# Patient Record
Sex: Female | Born: 1965 | Race: White | Hispanic: No | Marital: Married | Smoking: Current every day smoker
Health system: Southern US, Community
[De-identification: ages and names within clinical notes are randomized; demographics above are authoritative.]

## PROBLEM LIST (undated history)

## (undated) DIAGNOSIS — L570 Actinic keratosis: Secondary | ICD-10-CM

## (undated) HISTORY — DX: Actinic keratosis: L57.0

---

## 2005-02-20 ENCOUNTER — Ambulatory Visit: Payer: Self-pay | Admitting: Urology

## 2007-04-17 ENCOUNTER — Ambulatory Visit: Payer: Self-pay | Admitting: Internal Medicine

## 2010-03-14 ENCOUNTER — Ambulatory Visit: Payer: Self-pay | Admitting: Internal Medicine

## 2012-05-14 ENCOUNTER — Ambulatory Visit: Payer: Self-pay | Admitting: Internal Medicine

## 2013-05-03 ENCOUNTER — Ambulatory Visit: Payer: Self-pay | Admitting: Family Medicine

## 2014-04-24 ENCOUNTER — Ambulatory Visit: Payer: Self-pay | Admitting: Internal Medicine

## 2015-02-26 ENCOUNTER — Ambulatory Visit (INDEPENDENT_AMBULATORY_CARE_PROVIDER_SITE_OTHER): Payer: No Typology Code available for payment source | Admitting: Physician Assistant

## 2018-12-13 ENCOUNTER — Other Ambulatory Visit: Payer: Self-pay

## 2018-12-13 ENCOUNTER — Ambulatory Visit: Payer: 59 | Admitting: Podiatry

## 2018-12-13 ENCOUNTER — Ambulatory Visit: Payer: 59

## 2018-12-13 ENCOUNTER — Encounter: Payer: Self-pay | Admitting: Podiatry

## 2018-12-13 VITALS — Temp 97.6°F

## 2018-12-13 DIAGNOSIS — B351 Tinea unguium: Secondary | ICD-10-CM

## 2018-12-13 DIAGNOSIS — L989 Disorder of the skin and subcutaneous tissue, unspecified: Secondary | ICD-10-CM

## 2018-12-13 MED ORDER — TERBINAFINE HCL 250 MG PO TABS: 250.0000 mg | ORAL_TABLET | Freq: Every day | ORAL | 0 refills | Status: AC

## 2018-12-13 NOTE — Progress Notes (Signed)
   Subjective: Patient presents to the office today for chief complaint of painful callus lesions of the feet. Patient states that the pain is ongoing and is affecting their ability to ambulate without pain. Patient presents today for further treatment and evaluation.  No past medical history on file.   Objective:  Physical Exam General: Alert and oriented x3 in no acute distress  Dermatology: Hyperkeratotic lesion present on the plantar aspect of the left foot. Pain on palpation with a central nucleated core noted. Skin is warm, dry and supple bilateral lower extremities. Negative for open lesions or macerations. Thickened, dystrophic nails also noted to the bilateral great toes consistent with onychomycosis  Vascular: Palpable pedal pulses bilaterally. No edema or erythema noted. Capillary refill within normal limits.  Neurological: Epicritic and protective threshold grossly intact bilaterally.   Musculoskeletal Exam: Pain on palpation at the keratotic lesion noted. Range of motion within normal limits bilateral. Muscle strength 5/5 in all groups bilateral.  Assessment: 1.  Porokeratosis left foot 2.  Onychomycosis bilateral great toes   Plan of Care:  1. Patient evaluated 2. Excisional debridement of keratoic lesion using a chisel blade was performed without incident.  Salicylic acid applied 3. Dressed area with light dressing. 4.  Prescription for Lamisil 250 mg #90  5.  Patient is to return to the clinic PRN.   Edrick Kins, DPM [facility]  Dr. Edrick Kins, [address]                                        [place], [place] [address]                Office [phone]  Fax [phone]

## 2018-12-13 NOTE — Patient Instructions (Signed)
[place] Dream Arthritis Cream Tumeric

## 2019-01-30 ENCOUNTER — Other Ambulatory Visit: Payer: Self-pay | Admitting: Chiropractic Medicine

## 2019-01-30 DIAGNOSIS — M531 Cervicobrachial syndrome: Secondary | ICD-10-CM

## 2019-01-31 ENCOUNTER — Ambulatory Visit
Admission: RE | Admit: 2019-01-31 | Discharge: 2019-01-31 | Disposition: A | Discharge status: Home / Self Care | Payer: 59 | Source: Ambulatory Visit | Attending: Chiropractic Medicine | Admitting: Chiropractic Medicine

## 2019-01-31 ENCOUNTER — Other Ambulatory Visit: Payer: Self-pay | Admitting: Chiropractic Medicine

## 2019-01-31 DIAGNOSIS — M531 Cervicobrachial syndrome: Secondary | ICD-10-CM

## 2020-02-16 IMAGING — CR CERVICAL SPINE COMPLETE WITH FLEXION AND EXTENSION VIEWS
1 series · 5 of 5 positions shown · non-contrast
Comparison: None.

CLINICAL DATA: Chronic cervicalgia

EXAM:
CERVICAL SPINE, FRONTAL AND LATERAL WITH FLEXION AND EXTENSION VIEWS

[Series 1: dg cervical spine with flex & extend · 0.14mm/px · 5 of 5 slices shown]
[im 1/5]
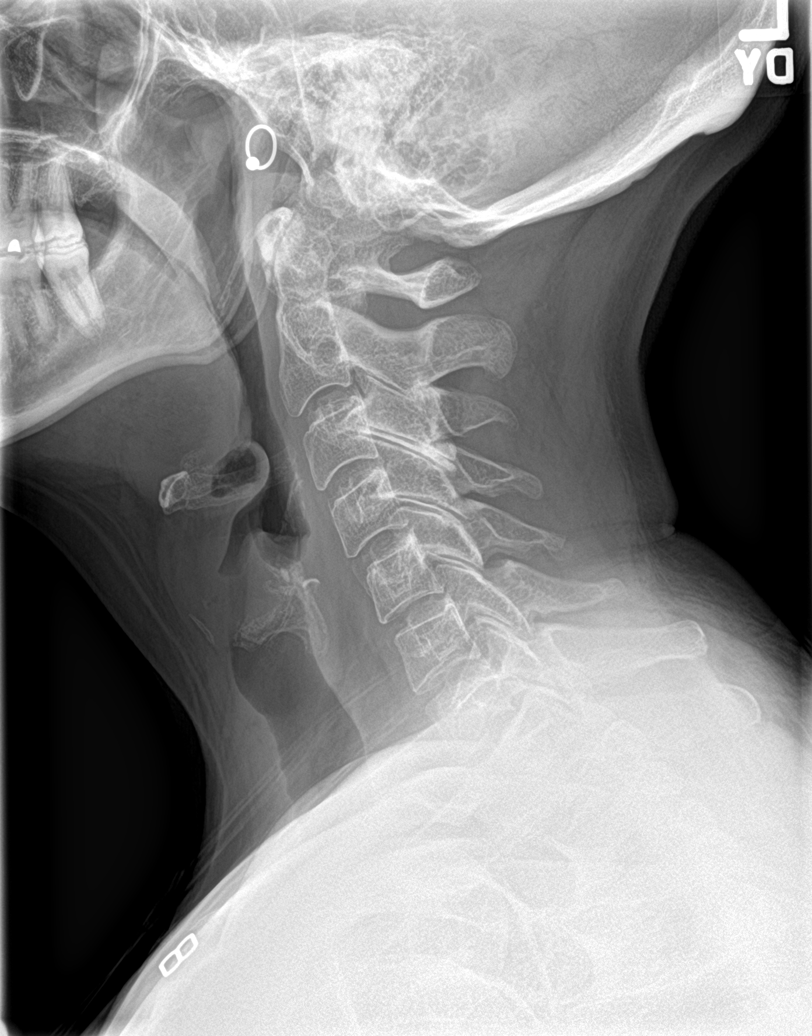
[im 2/5]
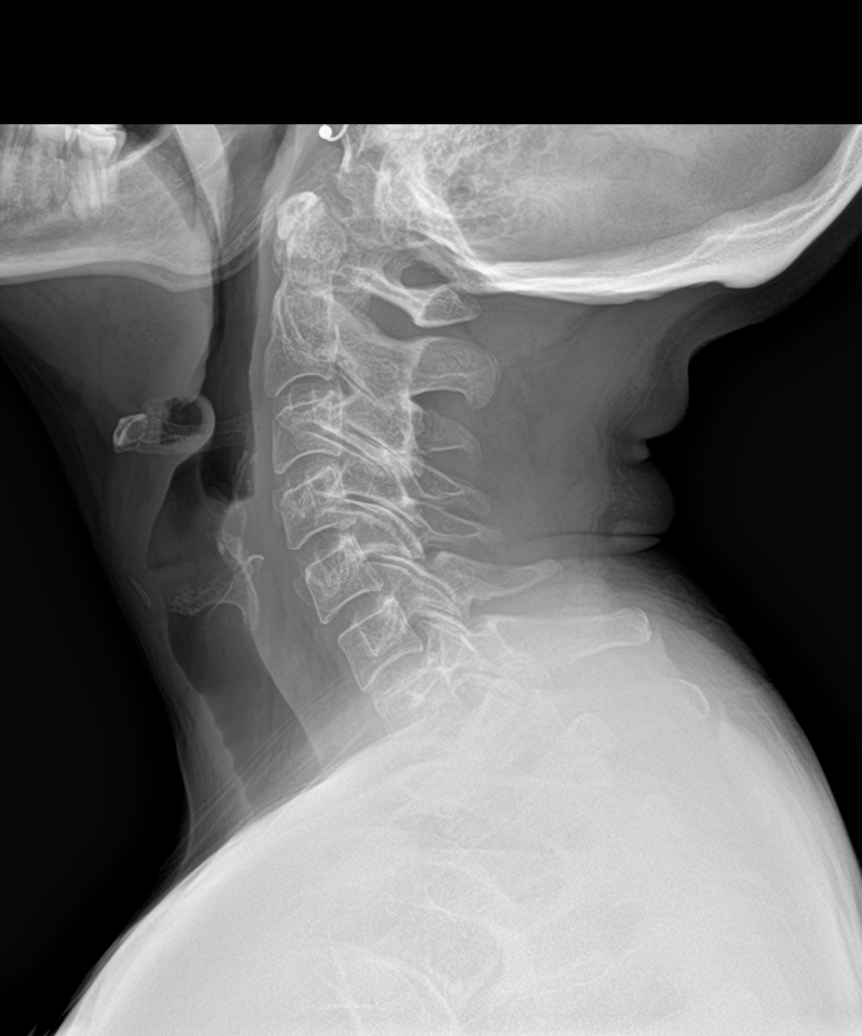
[im 3/5]
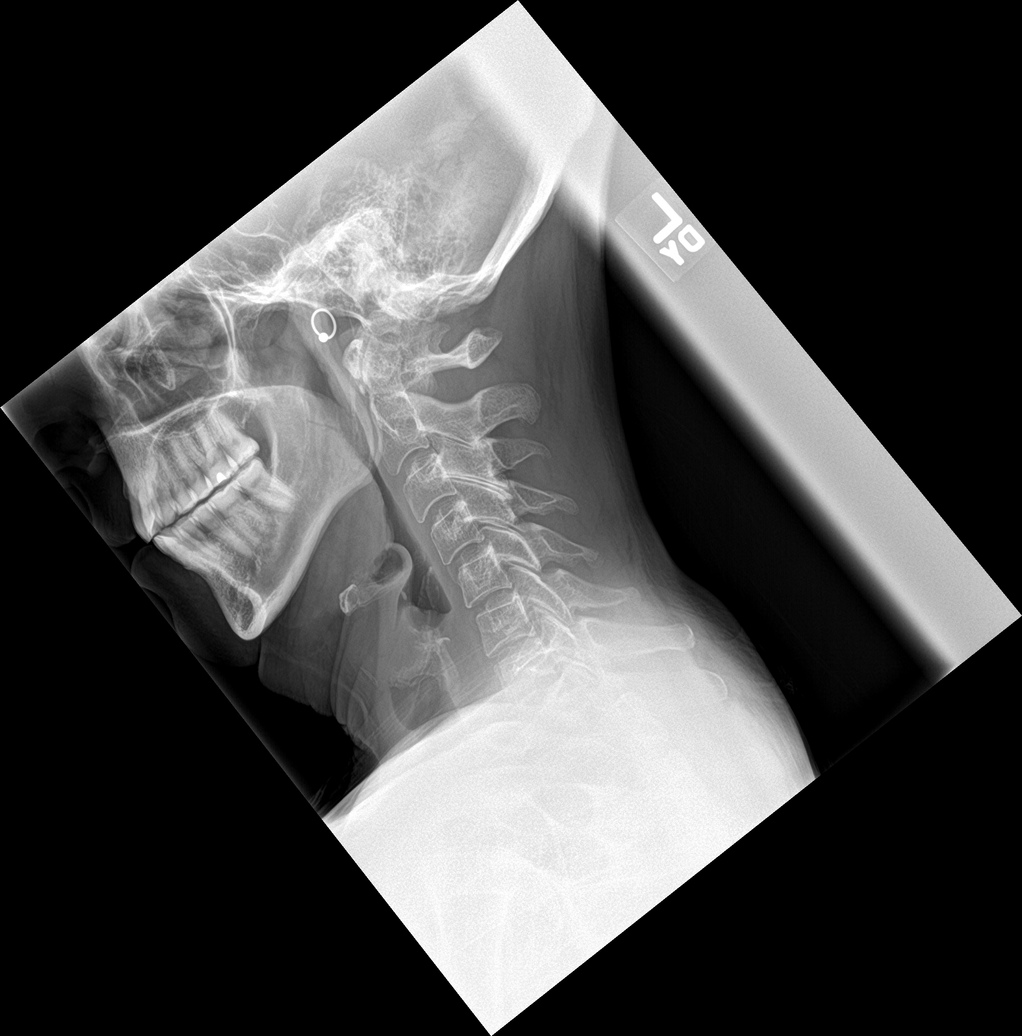
[im 4/5]
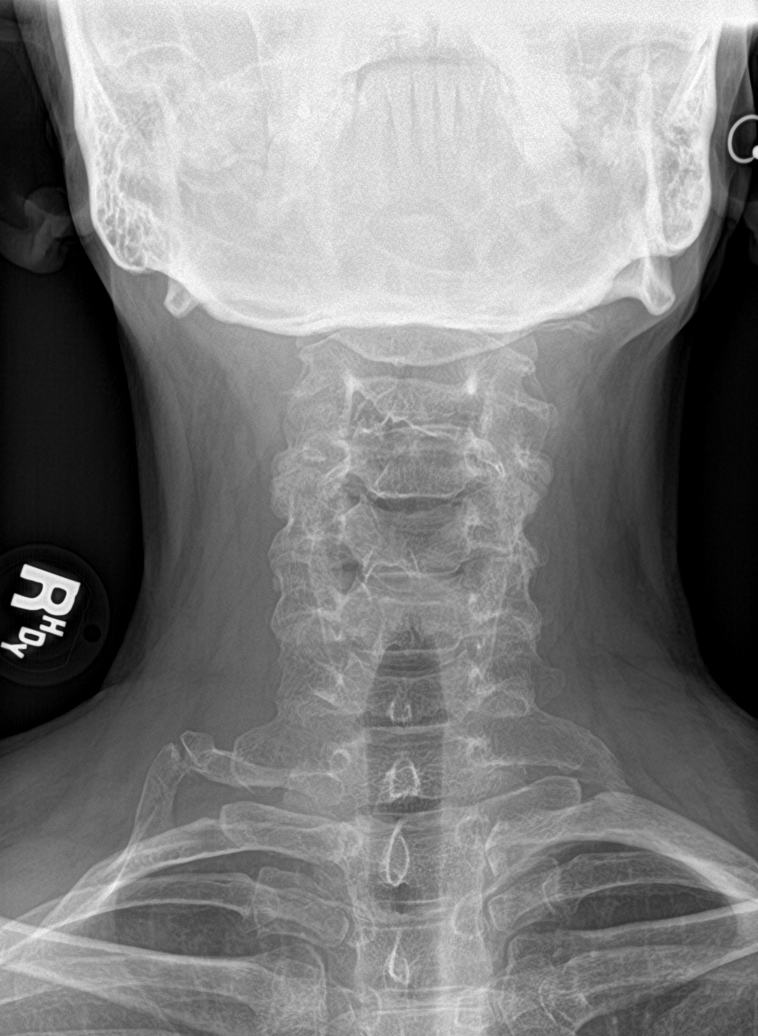
[im 5/5]
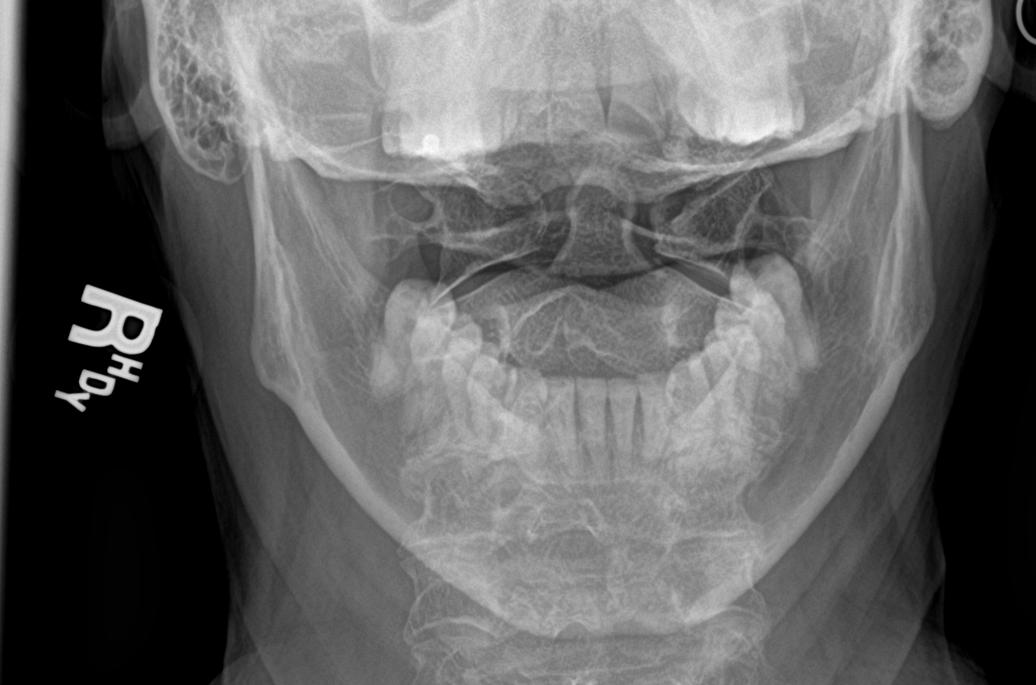

[5 of 5 positions shown; findings below may reference images not displayed]

FINDINGS: Frontal, open-mouth odontoid, neutral lateral, flexion lateral, and
extension lateral images obtained. There is no fracture. No
spondylolisthesis is evident on neutral lateral imaging. There is no
appreciable change in lateral alignment between neutral lateral,
flexion lateral, extension lateral imaging. Prevertebral soft
tissues and predental space regions are normal. There is moderate
disc space narrowing at C6-7. Other disc spaces appear unremarkable.
No erosive changes. Lung apices are clear. There are rudimentary
cervical ribs bilaterally, larger on the right than on the left.
IMPRESSION: No fracture. No spondylolisthesis. No change in lateral alignment
between neutral lateral, flexion lateral, extension lateral imaging.
There is disc space narrowing at C6-7. Other disc spaces appear
unremarkable.

Cervical ribs noted bilaterally, larger on the right than on the
left.

## 2020-09-22 ENCOUNTER — Encounter: Payer: Self-pay | Admitting: Dermatology

## 2020-09-22 ENCOUNTER — Ambulatory Visit: Payer: 59 | Admitting: Dermatology

## 2020-09-22 ENCOUNTER — Other Ambulatory Visit: Payer: Self-pay

## 2020-09-22 DIAGNOSIS — D229 Melanocytic nevi, unspecified: Secondary | ICD-10-CM

## 2020-09-22 DIAGNOSIS — D485 Neoplasm of uncertain behavior of skin: Secondary | ICD-10-CM | POA: Diagnosis not present

## 2020-09-22 DIAGNOSIS — L820 Inflamed seborrheic keratosis: Secondary | ICD-10-CM

## 2020-09-22 DIAGNOSIS — L720 Epidermal cyst: Secondary | ICD-10-CM

## 2020-09-22 DIAGNOSIS — L821 Other seborrheic keratosis: Secondary | ICD-10-CM | POA: Diagnosis not present

## 2020-09-22 NOTE — Patient Instructions (Addendum)
Wound Care Instructions  1. Cleanse wound gently with soap and water once a day then pat dry with clean gauze. Apply a thing coat of Petrolatum (petroleum jelly, "Vaseline") over the wound (unless you have an allergy to this). We recommend that you use a new, sterile tube of Vaseline. Do not pick or remove scabs. Do not remove the yellow or white "healing tissue" from the base of the wound.  2. Cover the wound with fresh, clean, nonstick gauze and secure with paper tape. You may use Band-Aids in place of gauze and tape if the would is small enough, but would recommend trimming much of the tape off as there is often too much. Sometimes Band-Aids can irritate the skin.  3. You should call the office for your biopsy report after 1 week if you have not already been contacted.  4. If you experience any problems, such as abnormal amounts of bleeding, swelling, significant bruising, significant pain, or evidence of infection, please call the office immediately.  Cryotherapy Aftercare  . Wash gently with soap and water everyday.   Marland Kitchen Apply Vaseline and Band-Aid daily until healed.  Prior to procedure, discussed risks of blister formation, small wound, skin dyspigmentation, or rare scar following cryotherapy.

## 2020-09-22 NOTE — Progress Notes (Signed)
New Patient Visit  Subjective  Joanna Rivera is a 55 y.o. female who presents for the following: Skin Problem (Patient with a spot at right thigh, present for about 1 year and has gotten bigger, dry and sore to touch. Also with a spot at left chest, present for about 6 months, no symptoms. She has a few places at back to be checked. ).  No personal or family history of skin cancer.   The following portions of the chart were reviewed this encounter and updated as appropriate:   Tobacco  Allergies  Meds  Problems  Med Hx  Surg Hx  Fam Hx      Review of Systems:  No other skin or systemic complaints except as noted in HPI or Assessment and Plan.  Objective  Well appearing patient in no apparent distress; mood and affect are within normal limits.  A focused examination was performed including back, legs, chest, left axilla. Relevant physical exam findings are noted in the Assessment and Plan.  Objective  Right Anterior Thigh: 0.7cm scaly pink papule       Objective  Left Axilla: 0.35cm pink crateriform papule      Objective  Left upper chest: Subcutaneous nodule.   Objective  Mid Back at braline: Erythematous keratotic or waxy stuck-on papule or plaque.    Assessment & Plan  Neoplasm of uncertain behavior of skin (2) Right Anterior Thigh  Skin / nail biopsy Type of biopsy: tangential   Informed consent: discussed and consent obtained   Timeout: patient name, date of birth, surgical site, and procedure verified   Patient was prepped and draped in usual sterile fashion: Area prepped with isopropyl alcohol. Anesthesia: the lesion was anesthetized in a standard fashion   Anesthetic:  1% lidocaine w/ epinephrine 1-100,000 buffered w/ 8.4% NaHCO3 Instrument used: flexible razor blade   Hemostasis achieved with: aluminum chloride   Outcome: patient tolerated procedure well   Post-procedure details: wound care instructions given   Additional details:   Mupirocin and a bandage applied Base curetted  Specimen 1 - Surgical pathology Differential Diagnosis: R/o SCC vs Hypertrophic AK  Check Margins: No 0.7cm scaly pink papule    Left Axilla  Epidermal / dermal shaving  Lesion diameter (cm):  0.4 Informed consent: discussed and consent obtained   Timeout: patient name, date of birth, surgical site, and procedure verified   Patient was prepped and draped in usual sterile fashion: area prepped with isopropyl alcohol. Anesthesia: the lesion was anesthetized in a standard fashion   Anesthetic:  1% lidocaine w/ epinephrine 1-100,000 buffered w/ 8.4% NaHCO3 Instrument used: flexible razor blade   Hemostasis achieved with: aluminum chloride   Outcome: patient tolerated procedure well   Post-procedure details: wound care instructions given   Additional details:  Mupirocin and a bandage applied  Specimen 2 - Surgical pathology Differential Diagnosis: R/o inflamed cyst vs SCC  Check Margins: No 0.35cm pink crateriform papule      Epidermal inclusion cyst Left upper chest  Benign-appearing. Exam most consistent with an epidermal inclusion cyst. Discussed that a cyst is a benign growth that can grow over time and sometimes get irritated or inflamed. Recommend observation if it is not bothersome. Discussed option of surgical excision to remove it if it is growing, symptomatic, or other changes noted. Please call for new or changing lesions so they can be evaluated.    Inflamed seborrheic keratosis Mid Back at braline  Prior to procedure, discussed risks of blister formation, small  wound, skin dyspigmentation, or rare scar following cryotherapy.    Destruction of lesion - Mid Back at braline Complexity: simple   Destruction method: cryotherapy   Informed consent: discussed and consent obtained   Lesion destroyed using liquid nitrogen: Yes   Cryotherapy cycles:  2 Outcome: patient tolerated procedure well with no complications    Post-procedure details: wound care instructions given    Melanocytic Nevi - Tan-brown and/or pink-flesh-colored symmetric macules and papules - Benign appearing on exam today - Observation - Call clinic for new or changing moles - Recommend daily use of broad spectrum spf 30+ sunscreen to sun-exposed areas.   Seborrheic Keratoses - Stuck-on, waxy, tan-brown papules and plaques  - Discussed benign etiology and prognosis. - Observe - Call for any changes  Return for TBSE.  Graciella Belton, RMA, am acting as scribe for Forest Gleason, MD .  Documentation: I have reviewed the above documentation for accuracy and completeness, and I agree with the above.  Forest Gleason, MD

## 2020-09-28 ENCOUNTER — Telehealth: Payer: Self-pay

## 2020-09-28 NOTE — Telephone Encounter (Signed)
Called patient and discussed biopsy results with patient. She verbalized understanding and denied questions at this time.

## 2020-09-28 NOTE — Telephone Encounter (Signed)
-----   Message from Alfonso Patten, MD sent at 09/27/2020 12:52 PM EST ----- 1. Skin , right anteiror thigh HYPERTROPHIC ACTINIC KERATOSIS --> LN2 in clinic at follow-up  2. Skin , left axilla CONSISTENT WITH SURFACE OF AN EPIDERMOID CYST "Benign cyst" --> no treatment needed if not bothersome  MAs please call. Thank you!

## 2021-01-06 ENCOUNTER — Encounter: Payer: 59 | Admitting: Dermatology

## 2021-05-31 ENCOUNTER — Ambulatory Visit: Payer: 59 | Admitting: Dermatology

## 2021-05-31 ENCOUNTER — Other Ambulatory Visit: Payer: Self-pay

## 2021-05-31 DIAGNOSIS — L570 Actinic keratosis: Secondary | ICD-10-CM

## 2021-05-31 DIAGNOSIS — Z872 Personal history of diseases of the skin and subcutaneous tissue: Secondary | ICD-10-CM

## 2021-05-31 DIAGNOSIS — L720 Epidermal cyst: Secondary | ICD-10-CM

## 2021-05-31 NOTE — Patient Instructions (Addendum)
Pre-Operative Instructions  You are scheduled for a surgical procedure at [facility]. We recommend you read the following instructions. If you have any questions or concerns, please call the office at [phone].  Shower and wash the entire body with soap and water the day of your surgery paying special attention to cleansing at and around the planned surgery site.  Avoid aspirin or aspirin containing products at least fourteen (14) days prior to your surgical procedure and for at least one week (7 Days) after your surgical procedure. If you take aspirin on a regular basis for heart disease or history of stroke or for any other reason, we may recommend you continue taking aspirin but please notify us if you take this on a regular basis. Aspirin can cause more bleeding to occur during surgery as well as prolonged bleeding and bruising after surgery.   Avoid other nonsteroidal pain medications at least one week prior to surgery and at least one week prior to your surgery. These include medications such as Ibuprofen (Motrin, Advil and Nuprin), Naprosyn, Voltaren, Relafen, etc. If medications are used for therapeutic reasons, please inform us as they can cause increased bleeding or prolonged bleeding during and bruising after surgical procedures.   Please advise [place] if you are taking any "blood thinner" medications such as Coumadin or Dipyridamole or Plavix or similar medications. These cause increased bleeding and prolonged bleeding during procedures and bruising after surgical procedures. We may have to consider discontinuing these medications briefly prior to and shortly after your surgery if safe to do so.   Please inform us of all medications you are currently taking. All medications that are taken regularly should be taken the day of surgery as you always do. Nevertheless, we need to be informed of what medications you are taking prior to surgery to know whether they will affect the  procedure or cause any complications.   Please inform us of any medication allergies. Also inform us of whether you have allergies to Latex or rubber products or whether you have had any adverse reaction to Lidocaine or Epinephrine.  Please inform us of any prosthetic or artificial body parts such as artificial heart valve, joint replacements, etc., or similar condition that might require preoperative antibiotics.   We recommend avoidance of alcohol at least two weeks prior to surgery and continued avoidance for at least two weeks after surgery.   We recommend discontinuation of tobacco smoking at least two weeks prior to surgery and continued abstinence for at least two weeks after surgery.  Do not plan strenuous exercise, strenuous work or strenuous lifting for approximately four weeks after your surgery.   We request if you are unable to make your scheduled surgical appointment, please call us at least a week in advance or as soon as you are aware of a problem so that we can cancel or reschedule the appointment.   You MAY TAKE TYLENOL (acetaminophen) for pain as it is not a blood thinner.   PLEASE PLAN TO BE IN [place] FOR TWO WEEKS FOLLOWING SURGERY, THIS IS IMPORTANT SO YOU CAN BE CHECKED FOR DRESSING CHANGES, SUTURE REMOVAL AND TO MONITOR FOR POSSIBLE COMPLICATIONS.      If you have any questions or concerns for your doctor, please call our main line at [phone] and press option 4 to reach your doctor's medical assistant. If no one answers, please leave a voicemail as directed and we will return your call as soon as possible. Messages left after 4  pm will be answered the following business day.   You may also send [place] a message via [place]. We typically respond to MyChart messages within 1-2 business days.  For prescription refills, please ask your pharmacy to contact our office. Our fax number is [phone].  If you have an urgent issue when the clinic is closed that cannot wait  until the next business day, you can page your doctor at the number below.    Please note that while we do our best to be available for urgent issues outside of office hours, we are not available 24/7.   If you have an urgent issue and are unable to reach [place], you may choose to seek medical care at your doctor's office, retail clinic, urgent care center, or emergency room.  If you have a medical emergency, please immediately call 911 or go to the emergency department.  Pager Numbers  - Dr. Nehemiah Massed: [phone]  - Dr. Laurence Ferrari: [phone]  - Dr. Nicole Kindred: [phone]  In the event of inclement weather, please call our main line at [phone] for an update on the status of any delays or closures.  Dermatology Medication Tips: Please keep the boxes that topical medications come in in order to help keep track of the instructions about where and how to use these. Pharmacies typically print the medication instructions only on the boxes and not directly on the medication tubes.   If your medication is too expensive, please contact our office at [phone] option 4 or send [place] a message through [place].   We are unable to tell what your co-pay for medications will be in advance as this is different depending on your insurance coverage. However, we may be able to find a substitute medication at lower cost or fill out paperwork to get insurance to cover a needed medication.   If a prior authorization is required to get your medication covered by your insurance company, please allow [place] 1-2 business days to complete this process.  Drug prices often vary depending on where the prescription is filled and some pharmacies may offer cheaper prices.  The website www.goodrx.com contains coupons for medications through different pharmacies. The prices here do not account for what the cost may be with help from insurance (it may be cheaper with your insurance), but the website can give you the price if you  did not use any insurance.  - You can print the associated coupon and take it with your prescription to the pharmacy.  - You may also stop by our office during regular business hours and pick up a GoodRx coupon card.  - If you need your prescription sent electronically to a different pharmacy, notify our office through [facility] or by phone at [phone] option 4.

## 2021-05-31 NOTE — Progress Notes (Signed)
   Follow-Up Visit   Subjective  EVALIN Joanna Rivera is a 55 y.o. female who presents for the following: Skin Problem (Check spot on the left upper chest, sometimes this area will get larger.). 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  Objective  Well appearing patient in no apparent distress; mood and affect are within normal limits.  A focused examination was performed including chest, thigh. Relevant physical exam findings are noted in the Assessment and Plan.  left chest 0.7 cm Subcutaneous nodule.    Assessment & Plan  Epidermal inclusion cyst left chest  Benign-appearing. Exam most consistent with an epidermal inclusion cyst. Discussed that a cyst is a benign growth that can grow over time and sometimes get irritated or inflamed. Recommend observation if it is not bothersome. Discussed option of surgical excision to remove it if it is growing, symptomatic, or other changes noted. Please call for new or changing lesions so they can be evaluated.   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 - these may recur and new lesions may form requiring treatment to prevent transformation into skin cancer - observe for new or changing spots and contact [place] for appointment if occur - photoprotection with sun protective clothing; sunglasses and broad spectrum sunscreen with SPF of at least 30 + and frequent self skin exams recommended - yearly exams by a dermatologist recommended for persons with history of PreCancerous Actinic Keratoses  Return in about 3 months (around 08/31/2021) for TBSE, Hx of AKs .  I, Marye Round,  CMA, am acting as scribe for Forest Gleason, MD .   Documentation: I have reviewed the above documentation for accuracy and completeness, and I agree with the above.  Forest Gleason, MD

## 2021-06-05 ENCOUNTER — Encounter: Payer: Self-pay | Admitting: Dermatology

## 2021-06-29 ENCOUNTER — Ambulatory Visit: Payer: 59 | Admitting: Dermatology

## 2021-06-29 ENCOUNTER — Other Ambulatory Visit: Payer: Self-pay

## 2021-06-29 DIAGNOSIS — D485 Neoplasm of uncertain behavior of skin: Secondary | ICD-10-CM

## 2021-06-29 DIAGNOSIS — L720 Epidermal cyst: Secondary | ICD-10-CM | POA: Diagnosis not present

## 2021-06-29 MED ORDER — MUPIROCIN 2 % EX OINT: 1.0000 "application " | TOPICAL_OINTMENT | Freq: Every day | CUTANEOUS | 0 refills | Status: AC

## 2021-06-29 NOTE — Progress Notes (Signed)
   Follow-Up Visit   Subjective  Joanna Rivera is a 55 y.o. female who presents for the following: Procedure (Patient here for excision of cyst at left chest.).    The following portions of the chart were reviewed this encounter and updated as appropriate:   Tobacco  Allergies  Meds  Problems  Med Hx  Surg Hx  Fam Hx      Review of Systems:  No other skin or systemic complaints except as noted in HPI or Assessment and Plan.  Objective  Well appearing patient in no apparent distress; mood and affect are within normal limits.  A focused examination was performed including chest. Relevant physical exam findings are noted in the Assessment and Plan.  Left chest 0.9 cm Subcutaneous nodule   Assessment & Plan  Neoplasm of uncertain behavior of skin Left chest  Skin excision  Lesion length (cm):  0.9 Total excision diameter (cm):  0.9 Informed consent: discussed and consent obtained   Timeout: patient name, date of birth, surgical site, and procedure verified   Procedure prep:  Patient was prepped and draped in usual sterile fashion Prep type:  Chlorhexidine Anesthesia: the lesion was anesthetized in a standard fashion   Anesthetic:  1% lidocaine w/ epinephrine 1-100,000 buffered w/ 8.4% NaHCO3 (3cc) Instrument used comment:  15c Hemostasis achieved with: suture, pressure and electrodesiccation   Outcome: patient tolerated procedure well with no complications   Post-procedure details: wound care instructions given   Additional details:  Mupirocin and a pressure dressing applied  Skin repair Complexity:  Intermediate Final length (cm):  1.2 Informed consent: discussed and consent obtained   Timeout: patient name, date of birth, surgical site, and procedure verified   Procedure prep:  Patient was prepped and draped in usual sterile fashion Prep type:  Chlorhexidine Anesthesia: the lesion was anesthetized in a standard fashion   Anesthetic:  1% lidocaine w/  epinephrine 1-100,000 local infiltration Reason for type of repair: reduce tension to allow closure, reduce the risk of dehiscence, infection, and necrosis, preserve normal anatomy and preserve normal anatomical and functional relationships   Undermining: edges undermined   Subcutaneous layers (deep stitches):  Suture size:  4-0 Suture type: Vicryl (polyglactin 910)   Fine/surface layer approximation (top stitches):  Suture size:  4-0 Suture type: Prolene (polypropylene)   Suture removal (days):  8 Hemostasis achieved with: suture, pressure and electrodesiccation Outcome: patient tolerated procedure well with no complications   Post-procedure details: wound care instructions given   Additional details:  Mupirocin and a pressure dressing applied  mupirocin ointment (BACTROBAN) 2 % Apply 1 application topically daily. With dressing changes  Specimen 1 - Surgical pathology Differential Diagnosis: r/o Cyst vs Other  Check Margins: No 0.9 cm Subcutaneous nodule  Return in about 1 week (around 07/06/2021) for Suture Removal.  Graciella Belton, RMA, am acting as scribe for Forest Gleason, MD .  Documentation: I have reviewed the above documentation for accuracy and completeness, and I agree with the above.  Forest Gleason, MD

## 2021-06-29 NOTE — Patient Instructions (Signed)
Wound Care Instructions  Cleanse wound gently with soap and water once a day then pat dry with clean gauze. Apply a thing coat of Petrolatum (petroleum jelly, "Vaseline") over the wound (unless you have an allergy to this). We recommend that you use a new, sterile tube of Vaseline. Do not pick or remove scabs. Do not remove the yellow or white "healing tissue" from the base of the wound.  Cover the wound with fresh, clean, nonstick gauze and secure with paper tape. You may use Band-Aids in place of gauze and tape if the would is small enough, but would recommend trimming much of the tape off as there is often too much. Sometimes Band-Aids can irritate the skin.  You should call the office for your biopsy report after 1 week if you have not already been contacted.  If you experience any problems, such as abnormal amounts of bleeding, swelling, significant bruising, significant pain, or evidence of infection, please call the office immediately.  FOR ADULT SURGERY PATIENTS: If you need something for pain relief you may take 1 extra strength Tylenol (acetaminophen) AND 2 Ibuprofen (200mg each) together every 4 hours as needed for pain. (do not take these if you are allergic to them or if you have a reason you should not take them.) Typically, you may only need pain medication for 1 to 3 days.   If you have any questions or concerns for your doctor, please call our main line at [phone] and press option 4 to reach your doctor's medical assistant. If no one answers, please leave a voicemail as directed and we will return your call as soon as possible. Messages left after 4 pm will be answered the following business day.   You may also send us a message via MyChart. We typically respond to MyChart messages within 1-2 business days.  For prescription refills, please ask your pharmacy to contact our office. Our fax number is [phone].  If you have an urgent issue when the clinic is closed that  cannot wait until the next business day, you can page your doctor at the number below.    Please note that while we do our best to be available for urgent issues outside of office hours, we are not available 24/7.   If you have an urgent issue and are unable to reach us, you may choose to seek medical care at your doctor's office, retail clinic, urgent care center, or emergency room.  If you have a medical emergency, please immediately call 911 or go to the emergency department.  Pager Numbers  - Dr. Kowalski: [phone]  - Dr. Moye: [phone]  - Dr. Stewart: [phone]  In the event of inclement weather, please call our main line at [phone] for an update on the status of any delays or closures.  Dermatology Medication Tips: Please keep the boxes that topical medications come in in order to help keep track of the instructions about where and how to use these. Pharmacies typically print the medication instructions only on the boxes and not directly on the medication tubes.   If your medication is too expensive, please contact our office at [phone] option 4 or send us a message through MyChart.   We are unable to tell what your co-pay for medications will be in advance as this is different depending on your insurance coverage. However, we may be able to find a substitute medication at lower cost or fill out paperwork to get insurance to cover a needed   medication.   If a prior authorization is required to get your medication covered by your insurance company, please allow us 1-2 business days to complete this process.  Drug prices often vary depending on where the prescription is filled and some pharmacies may offer cheaper prices.  The website www.goodrx.com contains coupons for medications through different pharmacies. The prices here do not account for what the cost may be with help from insurance (it may be cheaper with your insurance), but the website can give you the  price if you did not use any insurance.  - You can print the associated coupon and take it with your prescription to the pharmacy.  - You may also stop by our office during regular business hours and pick up a GoodRx coupon card.  - If you need your prescription sent electronically to a different pharmacy, notify our office through [facility] MyChart or by phone at [phone] option 4.   

## 2021-07-04 ENCOUNTER — Telehealth: Payer: Self-pay

## 2021-07-04 NOTE — Telephone Encounter (Addendum)
Called patient to discuss biopsy results. Patient verbalized understanding and denied further questions at this time.     ----- Message from Alfonso Patten, MD sent at 06/30/2021  5:15 PM EDT ----- Skin (M), left chest EPIDERMAL INCLUSION CYST  Benign cyst, no additional treatment needed

## 2021-07-06 ENCOUNTER — Encounter: Payer: Self-pay | Admitting: Dermatology

## 2021-07-07 ENCOUNTER — Ambulatory Visit (INDEPENDENT_AMBULATORY_CARE_PROVIDER_SITE_OTHER): Payer: 59 | Admitting: Dermatology

## 2021-07-07 ENCOUNTER — Other Ambulatory Visit: Payer: Self-pay

## 2021-07-07 DIAGNOSIS — Z4802 Encounter for removal of sutures: Secondary | ICD-10-CM

## 2021-07-07 DIAGNOSIS — L720 Epidermal cyst: Secondary | ICD-10-CM

## 2021-07-07 NOTE — Patient Instructions (Signed)
Recommend Serica moisturizing scar formula cream every night or [facility] brand or Mederma silicone scar sheet every night for the first year after a scar appears to help with scar remodeling if desired. Scars remodel on their own for a full year.  If you have any questions or concerns for your doctor, please call our main line at [phone] and press option 4 to reach your doctor's medical assistant. If no one answers, please leave a voicemail as directed and we will return your call as soon as possible. Messages left after 4 pm will be answered the following business day.   You may also send [place] a message via [place]. We typically respond to MyChart messages within 1-2 business days.  For prescription refills, please ask your pharmacy to contact our office. Our fax number is [phone].  If you have an urgent issue when the clinic is closed that cannot wait until the next business day, you can page your doctor at the number below.    Please note that while we do our best to be available for urgent issues outside of office hours, we are not available 24/7.   If you have an urgent issue and are unable to reach [place], you may choose to seek medical care at your doctor's office, retail clinic, urgent care center, or emergency room.  If you have a medical emergency, please immediately call 911 or go to the emergency department.  Pager Numbers  - Dr. Nehemiah Massed: [phone]  - Dr. Laurence Ferrari: [phone]  - Dr. Nicole Kindred: [phone]  In the event of inclement weather, please call our main line at [phone] for an update on the status of any delays or closures.  Dermatology Medication Tips: Please keep the boxes that topical medications come in in order to help keep track of the instructions about where and how to use these. Pharmacies typically print the medication instructions only on the boxes and not directly on the medication tubes.   If your medication is too expensive, please contact our  office at [phone] option 4 or send [place] a message through [place].   We are unable to tell what your co-pay for medications will be in advance as this is different depending on your insurance coverage. However, we may be able to find a substitute medication at lower cost or fill out paperwork to get insurance to cover a needed medication.   If a prior authorization is required to get your medication covered by your insurance company, please allow [place] 1-2 business days to complete this process.  Drug prices often vary depending on where the prescription is filled and some pharmacies may offer cheaper prices.  The website www.goodrx.com contains coupons for medications through different pharmacies. The prices here do not account for what the cost may be with help from insurance (it may be cheaper with your insurance), but the website can give you the price if you did not use any insurance.  - You can print the associated coupon and take it with your prescription to the pharmacy.  - You may also stop by our office during regular business hours and pick up a GoodRx coupon card.  - If you need your prescription sent electronically to a different pharmacy, notify our office through [facility] or by phone at [phone] option 4.

## 2021-07-07 NOTE — Progress Notes (Signed)
   Follow-Up Visit   Subjective  Joanna Rivera is a 55 y.o. female who presents for the following: post op/suture removal (Of excision site on the chest - pathology proven benign cyst, patient is here today for suture removal.).   The following portions of the chart were reviewed this encounter and updated as appropriate:   Tobacco  Allergies  Meds  Problems  Med Hx  Surg Hx  Fam Hx      Review of Systems:  No other skin or systemic complaints except as noted in HPI or Assessment and Plan.  Objective  Well appearing patient in no apparent distress; mood and affect are within normal limits.  A focused examination was performed including the chest. Relevant physical exam findings are noted in the Assessment and Plan.  Chest Incision site is clean, dry and intact     Assessment & Plan  Epidermal inclusion cyst Chest  Encounter for Removal of Sutures - Incision site at the chest is clean, dry and intact - Wound cleansed, sutures removed, wound cleansed and steri strips applied.  - Discussed pathology results showing a benign cyst  - Patient advised to keep steri-strips dry until they fall off. - Scars remodel for a full year. - Once steri-strips fall off, patient can apply over-the-counter silicone scar cream each night to help with scar remodeling if desired. - Patient advised to call with any concerns or if they notice any new or changing lesions.     Return for appointment as scheduled.  Luther Redo, CMA, am acting as scribe for Forest Gleason, MD .  Documentation: I have reviewed the above documentation for accuracy and completeness, and I agree with the above.  Forest Gleason, MD

## 2021-07-12 ENCOUNTER — Encounter: Payer: Self-pay | Admitting: Dermatology

## 2021-08-30 ENCOUNTER — Ambulatory Visit: Payer: 59 | Admitting: Dermatology

## 2021-09-01 ENCOUNTER — Ambulatory Visit: Payer: 59 | Admitting: Dermatology
